# Patient Record
Sex: Female | Born: 1935 | Race: White | Hispanic: No | Marital: Single
Health system: Southern US, Community
[De-identification: ages and names within clinical notes are randomized; demographics above are authoritative.]

---

## 2003-12-21 ENCOUNTER — Ambulatory Visit: Payer: Self-pay | Admitting: Gastroenterology

## 2003-12-25 ENCOUNTER — Ambulatory Visit: Payer: Self-pay | Admitting: Oncology

## 2004-01-03 ENCOUNTER — Ambulatory Visit: Payer: Self-pay | Admitting: Oncology

## 2004-01-09 ENCOUNTER — Ambulatory Visit: Payer: Self-pay | Admitting: Oncology

## 2004-06-24 ENCOUNTER — Ambulatory Visit: Payer: Self-pay | Admitting: Oncology

## 2004-07-08 ENCOUNTER — Ambulatory Visit: Payer: Self-pay | Admitting: Oncology

## 2005-01-06 ENCOUNTER — Ambulatory Visit: Payer: Self-pay | Admitting: Oncology

## 2005-01-13 ENCOUNTER — Ambulatory Visit: Payer: Self-pay | Admitting: Oncology

## 2005-02-07 ENCOUNTER — Ambulatory Visit: Payer: Self-pay | Admitting: Oncology

## 2005-07-28 ENCOUNTER — Ambulatory Visit: Payer: Self-pay | Admitting: Oncology

## 2005-08-08 ENCOUNTER — Ambulatory Visit: Payer: Self-pay | Admitting: Oncology

## 2006-01-09 ENCOUNTER — Ambulatory Visit: Payer: Self-pay | Admitting: Oncology

## 2006-01-22 ENCOUNTER — Ambulatory Visit: Payer: Self-pay | Admitting: Oncology

## 2006-02-07 ENCOUNTER — Ambulatory Visit: Payer: Self-pay | Admitting: Oncology

## 2006-04-05 ENCOUNTER — Emergency Department: Payer: Self-pay | Admitting: Emergency Medicine

## 2007-02-08 ENCOUNTER — Ambulatory Visit: Payer: Self-pay | Admitting: Oncology

## 2007-02-16 ENCOUNTER — Ambulatory Visit: Payer: Self-pay | Admitting: Oncology

## 2007-02-19 ENCOUNTER — Emergency Department: Payer: Self-pay | Admitting: Internal Medicine

## 2007-02-19 ENCOUNTER — Other Ambulatory Visit: Payer: Self-pay

## 2007-03-11 ENCOUNTER — Ambulatory Visit: Payer: Self-pay | Admitting: Oncology

## 2007-04-16 ENCOUNTER — Ambulatory Visit: Payer: Self-pay | Admitting: Oncology

## 2008-02-15 ENCOUNTER — Ambulatory Visit: Payer: Self-pay | Admitting: Oncology

## 2008-03-10 ENCOUNTER — Ambulatory Visit: Payer: Self-pay | Admitting: Oncology

## 2008-03-15 ENCOUNTER — Ambulatory Visit: Payer: Self-pay | Admitting: Oncology

## 2008-04-10 ENCOUNTER — Ambulatory Visit: Payer: Self-pay | Admitting: Oncology

## 2008-09-13 ENCOUNTER — Emergency Department: Payer: Self-pay | Admitting: Emergency Medicine

## 2009-02-08 ENCOUNTER — Inpatient Hospital Stay: Payer: Self-pay | Admitting: Internal Medicine

## 2009-02-14 ENCOUNTER — Encounter: Payer: Self-pay | Admitting: Internal Medicine

## 2009-03-10 ENCOUNTER — Encounter: Payer: Self-pay | Admitting: Internal Medicine

## 2009-12-13 ENCOUNTER — Emergency Department: Payer: Self-pay | Admitting: Unknown Physician Specialty

## 2010-05-19 ENCOUNTER — Emergency Department: Payer: Self-pay | Admitting: Emergency Medicine

## 2010-08-17 ENCOUNTER — Inpatient Hospital Stay: Payer: Self-pay | Admitting: Internal Medicine

## 2010-10-18 IMAGING — CR PELVIS - 1-2 VIEW
1 series · 1 of 1 positions shown · non-contrast
Comparison: none

REASON FOR EXAM: falls pain
COMMENTS:

PROCEDURE:     DXR - DXR PELVIS AP ONLY  - February 08, 2009  [DATE]
RESULT:     No acute abnormality.

[view not recorded]
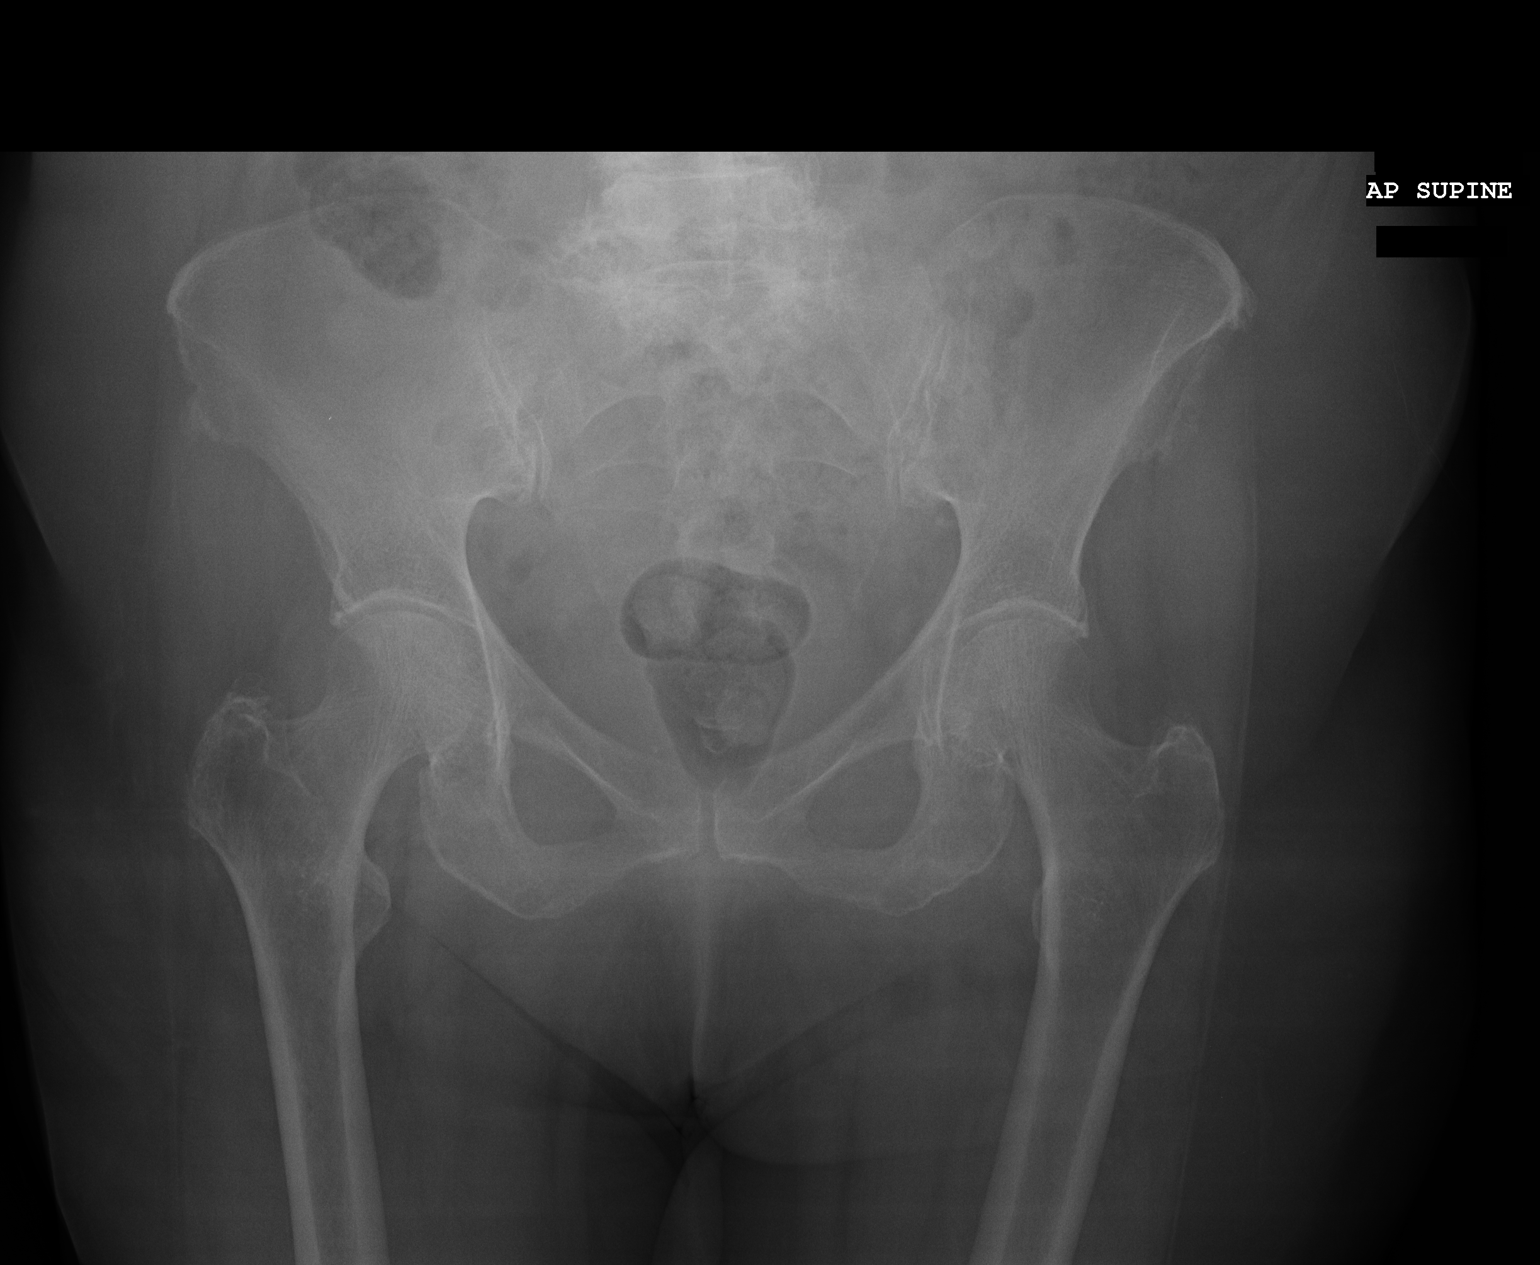

[1 of 1 positions shown; findings below may reference images not displayed]

IMPRESSION: No acute abnormality.

## 2010-10-18 IMAGING — CR DG CHEST 1V PORT
1 series · 1 of 1 positions shown · non-contrast
Comparison: none

REASON FOR EXAM: ams weakness
COMMENTS:

PROCEDURE:     DXR - DXR PORTABLE CHEST SINGLE VIEW  - February 08, 2009  [DATE]
RESULT:     No acute cardiopulmonary disease noted.

[view not recorded]
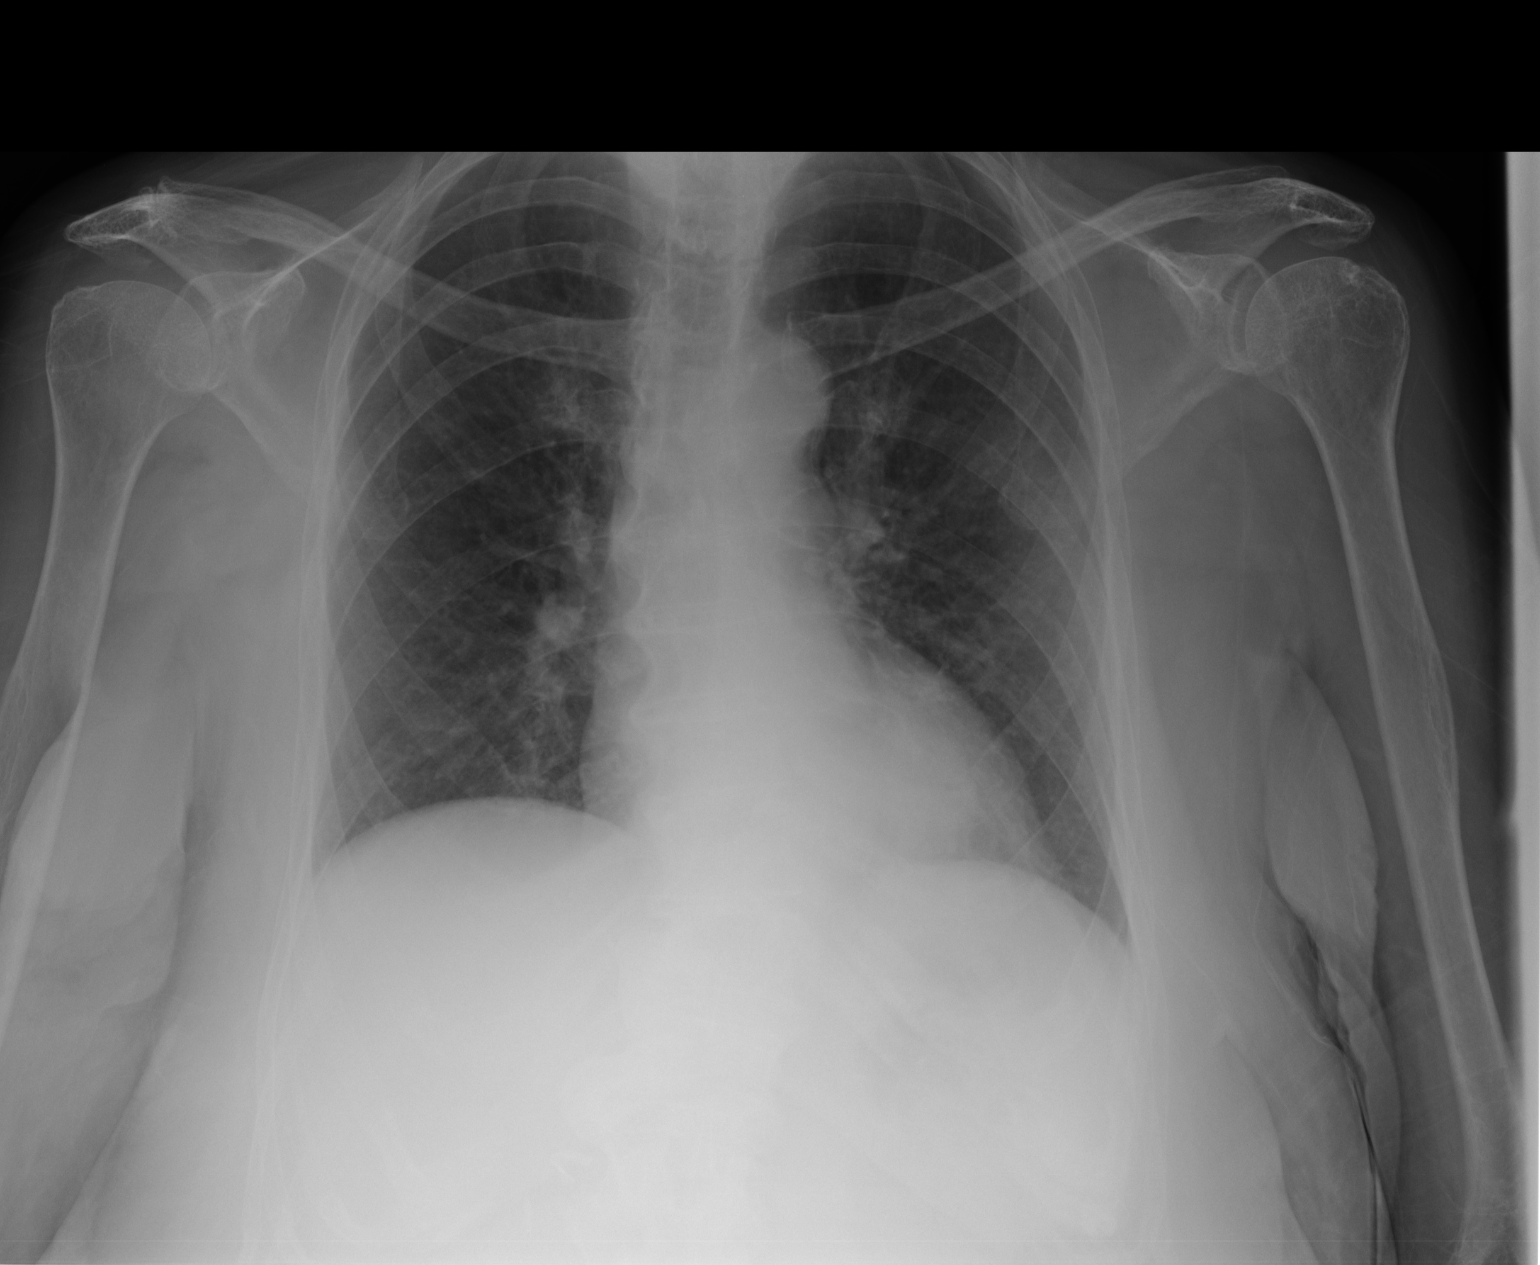

[1 of 1 positions shown; findings below may reference images not displayed]

IMPRESSION: No acute cardiopulmonary disease.

## 2010-10-18 IMAGING — CT CT HEAD WITHOUT CONTRAST
2 series · 16 of 30 positions shown, 20 images · non-contrast
Comparison: none

REASON FOR EXAM: altered mental status
COMMENTS:   May transport without cardiac monitor

[Series 2: without · axial · non-contrast · 0.39mm/px · z∈[+315,+440]mm · 13 of 31 slices shown, 17 images]
[im 3/31  brain]
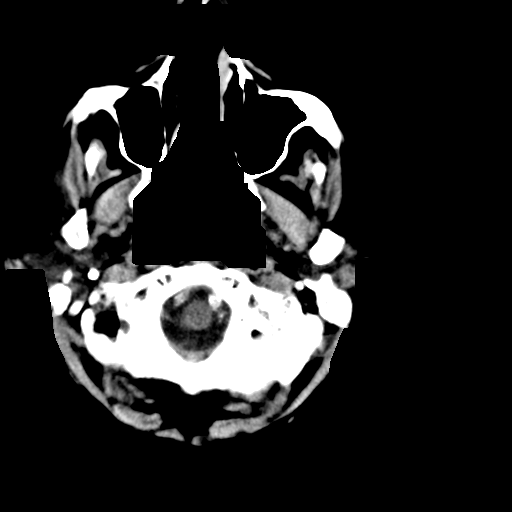
[im 3/31  bone]
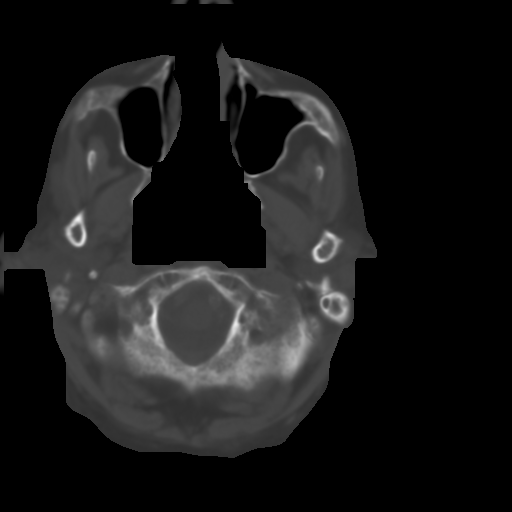
[im 5/31  brain]
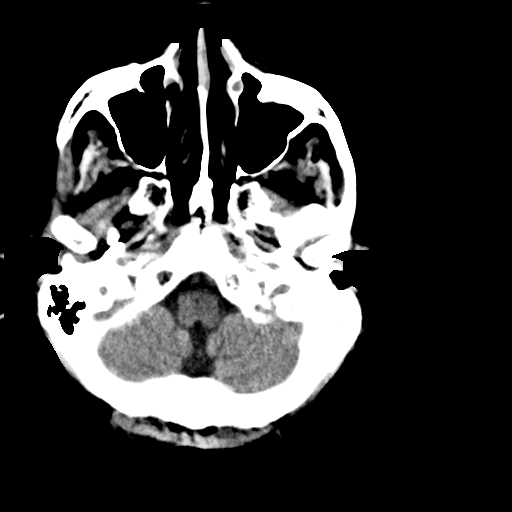
[im 7/31  brain]
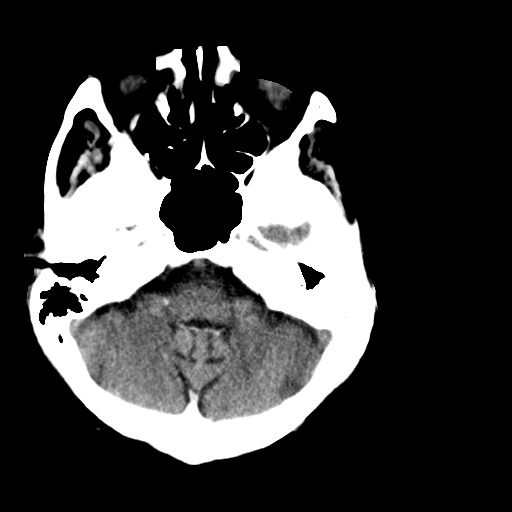
[im 9/31  brain]
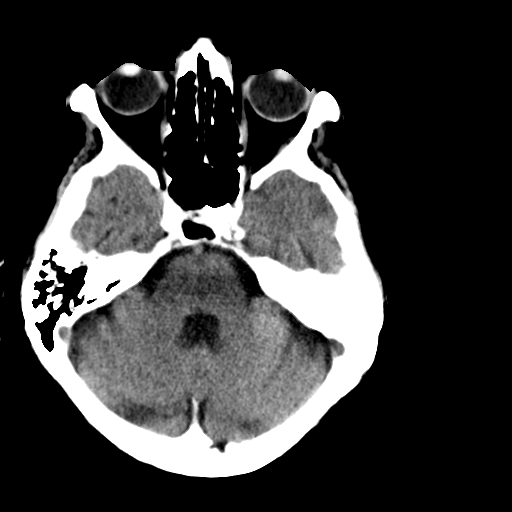
[im 11/31  brain]
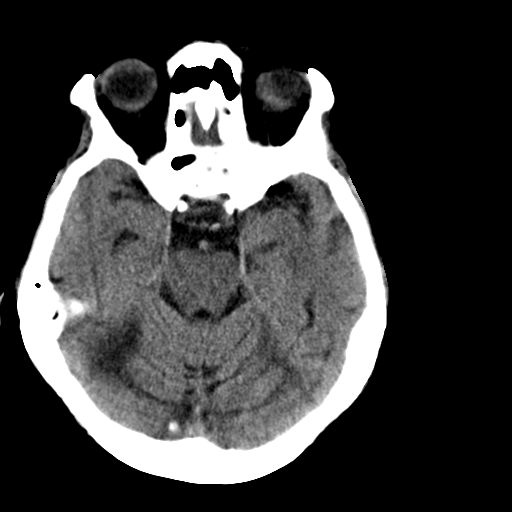
[im 11/31  bone]
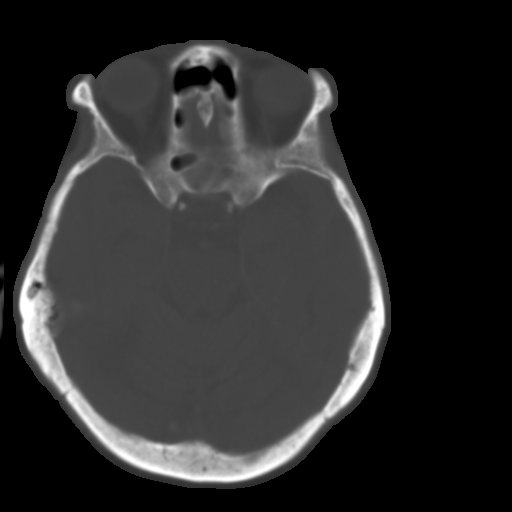
[im 13/31  brain]
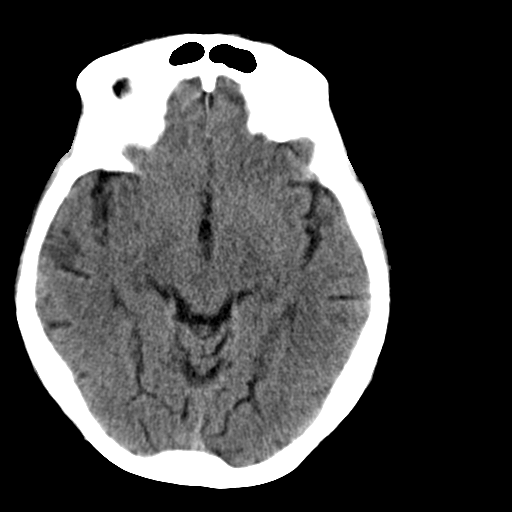
[im 16/31  brain]
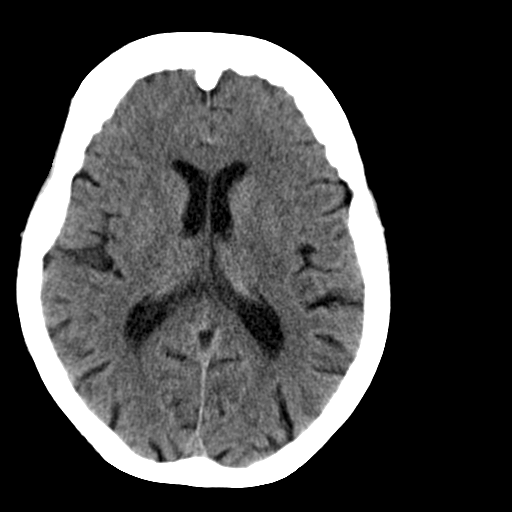
[im 18/31  brain]
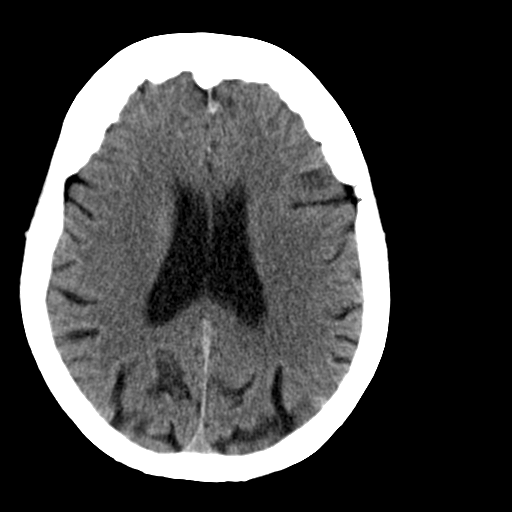
[im 20/31  brain]
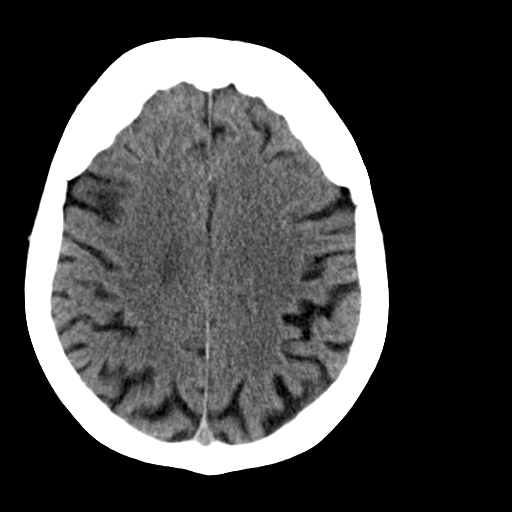
[im 20/31  bone]
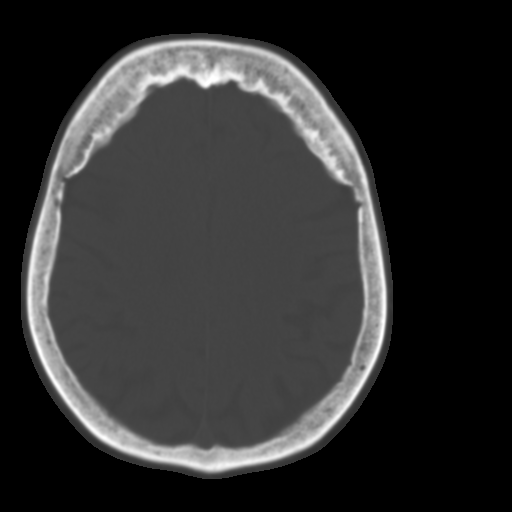
[im 22/31  brain]
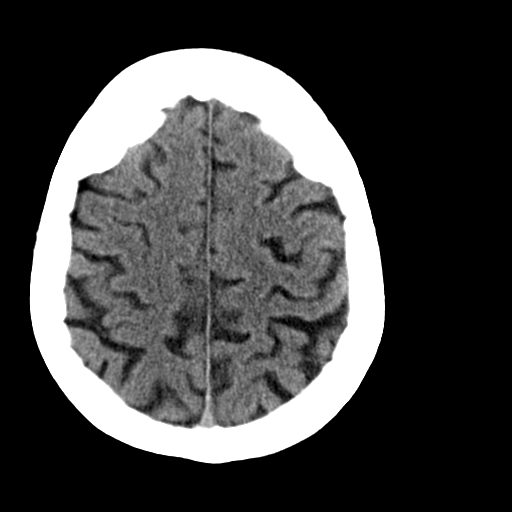
[im 24/31  brain]
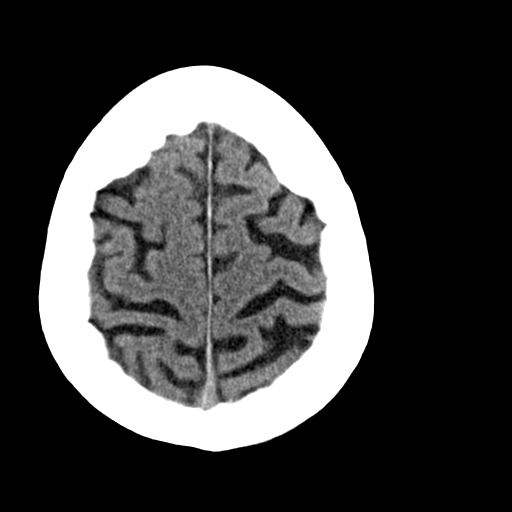
[im 26/31  brain]
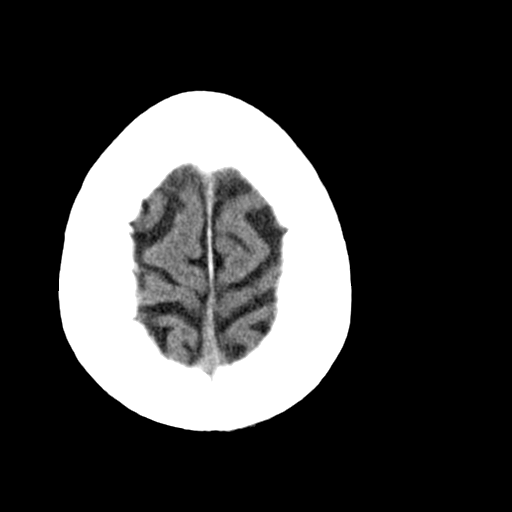
[im 28/31  brain]
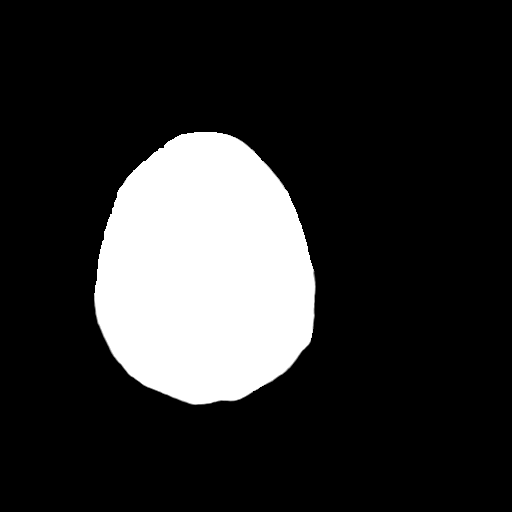
[im 28/31  bone]
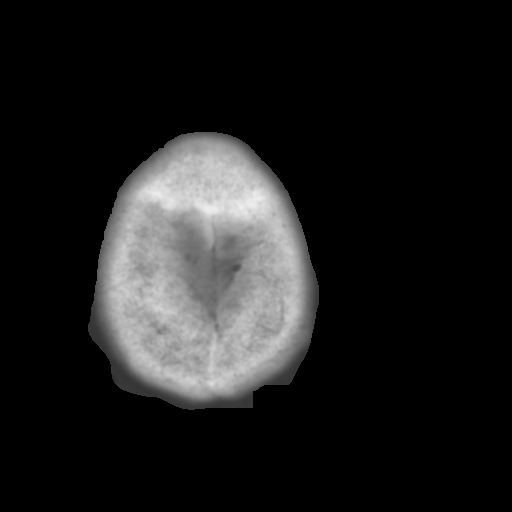

[Series 3: bone · axial · 0.39mm/px · z∈[+315,+355]mm · 3 of 31 slices shown]
[im 3/31  bone]
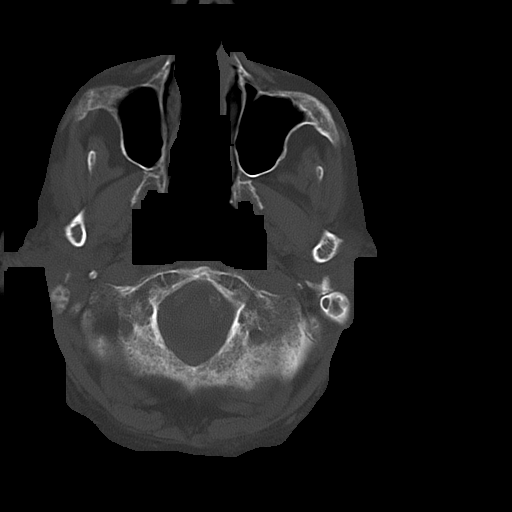
[im 7/31  bone]
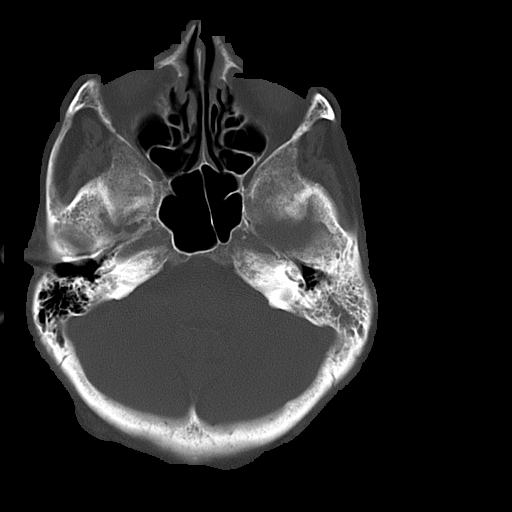
[im 11/31  bone]
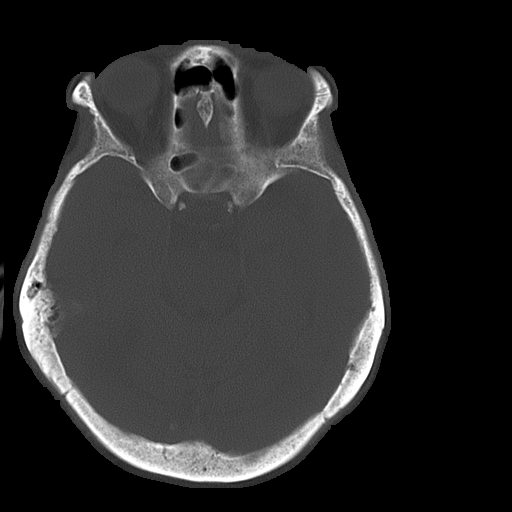

[16 of 30 positions shown; findings below may reference images not displayed]

PROCEDURE:     CT  - CT HEAD WITHOUT CONTRAST  - February 08, 2009  [DATE]

RESULT:     History: Altered mental status.

Comparison Study: Prior study of 09/13/2008.

Procedure and Findings:No intra-axial or extra-axial pathologic fluid or
blood collections identified. No mass lesions noted. No hydrocephalus. No
acute bony abnormality.
IMPRESSION: No acute abnormality.

## 2010-11-12 ENCOUNTER — Emergency Department: Payer: Self-pay | Admitting: Emergency Medicine

## 2011-05-04 ENCOUNTER — Emergency Department: Payer: Self-pay | Admitting: Emergency Medicine

## 2012-04-01 ENCOUNTER — Emergency Department: Payer: Self-pay | Admitting: Emergency Medicine

## 2013-01-03 ENCOUNTER — Emergency Department: Payer: Self-pay | Admitting: Emergency Medicine

## 2013-01-12 ENCOUNTER — Emergency Department: Payer: Self-pay | Admitting: Emergency Medicine

## 2013-04-30 ENCOUNTER — Inpatient Hospital Stay: Payer: Self-pay | Admitting: Internal Medicine

## 2013-05-05 DIAGNOSIS — J189 Pneumonia, unspecified organism: Secondary | ICD-10-CM

## 2013-05-05 DIAGNOSIS — R32 Unspecified urinary incontinence: Secondary | ICD-10-CM

## 2013-05-05 DIAGNOSIS — F0280 Dementia in other diseases classified elsewhere without behavioral disturbance: Secondary | ICD-10-CM

## 2013-05-05 DIAGNOSIS — K219 Gastro-esophageal reflux disease without esophagitis: Secondary | ICD-10-CM

## 2013-05-05 DIAGNOSIS — G309 Alzheimer's disease, unspecified: Secondary | ICD-10-CM

## 2013-05-31 DIAGNOSIS — F411 Generalized anxiety disorder: Secondary | ICD-10-CM

## 2013-05-31 DIAGNOSIS — K219 Gastro-esophageal reflux disease without esophagitis: Secondary | ICD-10-CM

## 2013-05-31 DIAGNOSIS — G309 Alzheimer's disease, unspecified: Secondary | ICD-10-CM

## 2013-05-31 DIAGNOSIS — M159 Polyosteoarthritis, unspecified: Secondary | ICD-10-CM

## 2013-05-31 DIAGNOSIS — F0280 Dementia in other diseases classified elsewhere without behavioral disturbance: Secondary | ICD-10-CM

## 2013-07-06 DIAGNOSIS — N052 Unspecified nephritic syndrome with diffuse membranous glomerulonephritis: Secondary | ICD-10-CM

## 2013-07-06 DIAGNOSIS — F0280 Dementia in other diseases classified elsewhere without behavioral disturbance: Secondary | ICD-10-CM

## 2013-07-06 DIAGNOSIS — IMO0002 Reserved for concepts with insufficient information to code with codable children: Secondary | ICD-10-CM

## 2013-07-06 DIAGNOSIS — G309 Alzheimer's disease, unspecified: Secondary | ICD-10-CM

## 2013-07-06 DIAGNOSIS — F411 Generalized anxiety disorder: Secondary | ICD-10-CM

## 2013-07-06 DIAGNOSIS — M1710 Unilateral primary osteoarthritis, unspecified knee: Secondary | ICD-10-CM

## 2013-08-02 DIAGNOSIS — G309 Alzheimer's disease, unspecified: Secondary | ICD-10-CM

## 2013-08-02 DIAGNOSIS — IMO0002 Reserved for concepts with insufficient information to code with codable children: Secondary | ICD-10-CM

## 2013-08-02 DIAGNOSIS — F411 Generalized anxiety disorder: Secondary | ICD-10-CM

## 2013-08-02 DIAGNOSIS — F0280 Dementia in other diseases classified elsewhere without behavioral disturbance: Secondary | ICD-10-CM

## 2013-08-02 DIAGNOSIS — M159 Polyosteoarthritis, unspecified: Secondary | ICD-10-CM

## 2013-08-02 DIAGNOSIS — K219 Gastro-esophageal reflux disease without esophagitis: Secondary | ICD-10-CM

## 2013-08-24 DIAGNOSIS — N052 Unspecified nephritic syndrome with diffuse membranous glomerulonephritis: Secondary | ICD-10-CM

## 2013-08-24 DIAGNOSIS — IMO0002 Reserved for concepts with insufficient information to code with codable children: Secondary | ICD-10-CM

## 2013-08-24 DIAGNOSIS — F411 Generalized anxiety disorder: Secondary | ICD-10-CM

## 2013-08-24 DIAGNOSIS — G309 Alzheimer's disease, unspecified: Secondary | ICD-10-CM

## 2013-08-24 DIAGNOSIS — F0280 Dementia in other diseases classified elsewhere without behavioral disturbance: Secondary | ICD-10-CM

## 2013-08-24 DIAGNOSIS — M1710 Unilateral primary osteoarthritis, unspecified knee: Secondary | ICD-10-CM

## 2013-08-24 DIAGNOSIS — R609 Edema, unspecified: Secondary | ICD-10-CM

## 2013-08-24 DIAGNOSIS — F430 Acute stress reaction: Secondary | ICD-10-CM

## 2013-10-11 ENCOUNTER — Ambulatory Visit: Payer: Self-pay | Admitting: Internal Medicine

## 2013-10-20 DIAGNOSIS — J209 Acute bronchitis, unspecified: Secondary | ICD-10-CM

## 2013-12-21 DIAGNOSIS — M1990 Unspecified osteoarthritis, unspecified site: Secondary | ICD-10-CM

## 2013-12-21 DIAGNOSIS — F419 Anxiety disorder, unspecified: Secondary | ICD-10-CM

## 2013-12-21 DIAGNOSIS — R451 Restlessness and agitation: Secondary | ICD-10-CM

## 2013-12-21 DIAGNOSIS — K219 Gastro-esophageal reflux disease without esophagitis: Secondary | ICD-10-CM

## 2013-12-21 DIAGNOSIS — R609 Edema, unspecified: Secondary | ICD-10-CM

## 2013-12-21 DIAGNOSIS — G309 Alzheimer's disease, unspecified: Secondary | ICD-10-CM

## 2014-01-02 DIAGNOSIS — I639 Cerebral infarction, unspecified: Secondary | ICD-10-CM

## 2014-02-21 DIAGNOSIS — I872 Venous insufficiency (chronic) (peripheral): Secondary | ICD-10-CM

## 2014-02-21 DIAGNOSIS — K219 Gastro-esophageal reflux disease without esophagitis: Secondary | ICD-10-CM

## 2014-02-21 DIAGNOSIS — R451 Restlessness and agitation: Secondary | ICD-10-CM

## 2014-02-21 DIAGNOSIS — G301 Alzheimer's disease with late onset: Secondary | ICD-10-CM

## 2014-02-21 DIAGNOSIS — F419 Anxiety disorder, unspecified: Secondary | ICD-10-CM

## 2014-03-13 DIAGNOSIS — J069 Acute upper respiratory infection, unspecified: Secondary | ICD-10-CM

## 2014-03-15 DIAGNOSIS — J069 Acute upper respiratory infection, unspecified: Secondary | ICD-10-CM

## 2014-04-04 DIAGNOSIS — L309 Dermatitis, unspecified: Secondary | ICD-10-CM

## 2014-04-26 DIAGNOSIS — G309 Alzheimer's disease, unspecified: Secondary | ICD-10-CM

## 2014-04-26 DIAGNOSIS — I872 Venous insufficiency (chronic) (peripheral): Secondary | ICD-10-CM

## 2014-04-26 DIAGNOSIS — F419 Anxiety disorder, unspecified: Secondary | ICD-10-CM

## 2014-04-26 DIAGNOSIS — K219 Gastro-esophageal reflux disease without esophagitis: Secondary | ICD-10-CM

## 2014-04-26 DIAGNOSIS — R451 Restlessness and agitation: Secondary | ICD-10-CM

## 2014-05-25 DIAGNOSIS — R1110 Vomiting, unspecified: Secondary | ICD-10-CM | POA: Diagnosis not present

## 2014-06-17 ENCOUNTER — Telehealth: Payer: Self-pay | Admitting: Family Medicine

## 2014-06-17 ENCOUNTER — Inpatient Hospital Stay: Admit: 2014-06-17 | Disposition: E | Payer: Self-pay | Attending: Internal Medicine | Admitting: Internal Medicine

## 2014-06-17 NOTE — Telephone Encounter (Signed)
Received nursing home call in regards to sats 74% on 2L oxygen when previously >90% without oxygen. History chest mass reported. Reported to me that there were no family or decision makers.   Reached out to PCP Dr. Alphonsus SiasLetvak and he is going to call to investigate. He stated he needed no further current assistance from me.

## 2014-06-19 NOTE — Telephone Encounter (Signed)
Was sent to ER and diagnosed with aspiration pneumonia and multisystem failure  Heard from niece this morning---she died earlier today.

## 2014-06-19 NOTE — Telephone Encounter (Signed)
PLEASE NOTE: All timestamps contained within this report are represented as [place] Time. CONFIDENTIALTY NOTICE: This fax transmission is intended only for the addressee. It contains information that is legally privileged, confidential or otherwise protected from use or disclosure. If you are not the intended recipient, you are strictly prohibited from reviewing, disclosing, copying using or disseminating any of this information or taking any action in reliance on or regarding this information. If you have received this fax in error, please notify us immediately by telephone so that we can arrange for its return to us. Phone: [phone], Toll-Free: [phone], Fax: [phone] Page: 1 of 1 Call Id: [id number] [facility] [place] Client TELEPHONE ADVICE RECORD [facility] Patient Name: Bonnie Maxwell Gender: Female DOB: 02/11/1925 Age: [age] Y 8 M Return Phone Number: Address: City/State/Zip:  [facility] [place] [place] Night Physician Tillman AbideLetvak, Richard Contact Type Call Call Type Page Only Caller Name [place] Relationship To Patient Other Is this call to report lab results? 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 She is not in distress, but the RN wants to let the OC know. Requests to speak with OC MD. CB# [phone] Nurse Assessment Guidelines Guideline Title Affirmed Question Affirmed Notes Nurse Date/Time Lamount Cohen(Eastern Time) Disp. Time Lamount Cohen(Eastern Time) Disposition Final User 06/24/2014 3:15:20 PM [facility] Minna AntisRando, Eric 06/24/2014 3:19:19 PM Paged On Call to Other Provider Meryle ReadyBerezansky, Jacob 07/07/2014 3:19:33 PM Page Completed Yes Meryle ReadyBerezansky, Jacob After Care Instructions Given Call Event Type User Date / Time  Description Paging DoctorName DoctorPhone DateTime Result/Outcome Notes Tana ConchHunter, Stephen [phone] 06/10/2014 3:19:19 PM Paged On Call to Other Provider Tana ConchHunter, Stephen 06/23/2014 3:19:23 PM Paged On Call to Another Provider

## 2014-07-09 DEATH — deceased
